# Patient Record
Sex: Male | Born: 1980 | Race: White | Hispanic: No | Marital: Married
Health system: Southern US, Community
[De-identification: ages and names within clinical notes are randomized; demographics above are authoritative.]

---

## 2016-05-02 ENCOUNTER — Other Ambulatory Visit: Payer: Self-pay | Admitting: Family

## 2016-05-02 DIAGNOSIS — M7138 Other bursal cyst, other site: Secondary | ICD-10-CM

## 2016-05-07 ENCOUNTER — Ambulatory Visit
Admission: RE | Admit: 2016-05-07 | Discharge: 2016-05-07 | Disposition: A | Discharge status: Home / Self Care | Payer: 59 | Source: Ambulatory Visit | Attending: Family | Admitting: Family

## 2016-05-07 DIAGNOSIS — M7138 Other bursal cyst, other site: Secondary | ICD-10-CM

## 2016-05-28 ENCOUNTER — Ambulatory Visit: Payer: Self-pay | Admitting: Surgery

## 2016-05-28 NOTE — H&P (Signed)
Jeremy SalonBrian J. Aoun 05/28/2016 8:39 AM Location: [facility] Patient #: [id number] DOB: 06/21/1981 Married / Language: English / Race: White Male  History of Present Illness Ardeth Sportsman(Kaysie Michelini C. Jennalee Greaves MD; 05/28/2016 10:33 AM) The patient is a 35 year old male who presents with a soft tissue mass. Note for "Soft tissue mass": Patient sent for surgical consultation at the request of Boneta LucksJennifer Brown, nurse practitioner. Concern for scalp and back masses.  Pleasant 35 year old. Active male. Does not smoke. Has had a few small lumps removed off the scalp under local by PCP & dermatology. He has a new one now. It is the largest one. It gets uncomfortable and painful. Has not been infected or draining. Patient also notes he's had some lower back discomfort and persistent lump despite his massage therapist mother working to try and soften it. It's been there for several months. Consult made for that as well. He has no history of other skin or soft tissue problems. 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 He comes today with his wife. Has tattooing and piercings. No problems with infections or cellulitis related to those interventions.   Other Problems Gilmer Mor(Sonya Bynum, CMA; 05/28/2016 8:39 AM) Anxiety Disorder Depression High blood pressure Seizure Disorder  Diagnostic Studies History Gilmer Mor(Sonya Bynum, CMA; 05/28/2016 8:39 AM) Colonoscopy never  Allergies (Sonya Bynum, CMA; 05/28/2016 8:40 AM) Zithromax *MACROLIDES*  Medication History (Sonya Bynum, CMA; 05/28/2016 8:40 AM) Flonase (50MCG/ACT Suspension, Nasal as needed) Active. Medications Reconciled  Social History Gilmer Mor(Sonya Bynum, CMA; 05/28/2016 8:39 AM) Alcohol use Moderate alcohol use. No caffeine use No drug use Tobacco use Former smoker.  Family History Gilmer Mor(Sonya Bynum, CMA; 05/28/2016 8:39 AM) Alcohol Abuse Family Members In General. Arthritis Family Members In General. Breast Cancer Family  Members In General. Cancer Family Members In General. Depression Father, Mother. Heart Disease Family Members In General. Hypertension Family Members In General. Melanoma Family Members In General. Thyroid problems Father, Mother.     Review of Systems Lamar Laundry(Sonya Bynum CMA; 05/28/2016 8:39 AM) General Not Present- Appetite Loss, Chills, Fatigue, Fever, Night Sweats, Weight Gain and Weight Loss. Skin Not Present- Change in Wart/Mole, Dryness, Hives, Jaundice, New Lesions, Non-Healing Wounds, Rash and Ulcer. HEENT Present- Nose Bleed, Seasonal Allergies, Sinus Pain and Wears glasses/contact lenses. Not Present- Earache, Hearing Loss, Hoarseness, Oral Ulcers, Ringing in the Ears, Sore Throat, Visual Disturbances and Yellow Eyes. Respiratory Present- Snoring. Not Present- Bloody sputum, Chronic Cough, Difficulty Breathing and Wheezing. Breast Not Present- Breast Mass, Breast Pain, Nipple Discharge and Skin Changes. Gastrointestinal Present- Excessive gas and Nausea. Not Present- Abdominal Pain, Bloating, Bloody Stool, Change in Bowel Habits, Chronic diarrhea, Constipation, Difficulty Swallowing, Gets full quickly at meals, Hemorrhoids, Indigestion, Rectal Pain and Vomiting. Male Genitourinary Not Present- Blood in Urine, Change in Urinary Stream, Frequency, Impotence, Nocturia, Painful Urination, Urgency and Urine Leakage. Musculoskeletal Present- Back Pain and Muscle Pain. Not Present- Joint Pain, Joint Stiffness, Muscle Weakness and Swelling of Extremities. Neurological Not Present- Decreased Memory, Fainting, Headaches, Numbness, Seizures, Tingling, Tremor, Trouble walking and Weakness. Psychiatric Present- Depression and Frequent crying. Not Present- Anxiety, Bipolar, Change in Sleep Pattern and Fearful. Endocrine Not Present- Cold Intolerance, Excessive Hunger, Hair Changes, Heat Intolerance, Hot flashes and New Diabetes. Hematology Not Present- Blood Thinners, Easy Bruising, Excessive  bleeding, Gland problems, HIV and Persistent Infections.  Vitals (Sonya Bynum CMA; 05/28/2016 8:39 AM) 05/28/2016 8:39 AM Weight: 254 lb Height: 66in Body Surface Area: 2.21 m Body Mass Index: 41 kg/m  Temp.: 53F(Temporal)  Pulse:  79 (Regular)  BP: 128/82 (Sitting, Left Arm, Standard)      Physical Exam Ardeth Sportsman MD; 05/28/2016 9:22 AM)  General Mental Status-Alert. General Appearance-Not in acute distress, Not Sickly. Orientation-Oriented X3. Hydration-Well hydrated. Voice-Normal.  Integumentary Global Assessment Upon inspection and palpation of skin surfaces of the - Axillae: non-tender, no inflammation or ulceration, no drainage. and Distribution of scalp and body hair is normal. General Characteristics Temperature - normal warmth is noted. Note: Numerous tattoos on upper extremity and back. Anterior trunk spared  Head and Neck Head-normocephalic, atraumatic with no lesions or palpable masses. Face Global Assessment - atraumatic, no absence of expression. Neck Global Assessment - no abnormal movements, no bruit auscultated on the right, no bruit auscultated on the left, no decreased range of motion, non-tender. Trachea-midline. Thyroid Gland Characteristics - non-tender. Note: 2 x 2 centimeter mass on right posterior occiput of the head. 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 No active cellulitis or inflammation. No drainage.  Eye Eyeball - Left-Extraocular movements intact, No Nystagmus. Eyeball - Right-Extraocular movements intact, No Nystagmus. Cornea - Left-No Hazy. Cornea - Right-No Hazy. Sclera/Conjunctiva - Left-No scleral icterus, No Discharge. Sclera/Conjunctiva - Right-No scleral icterus, No Discharge. Pupil - Left-Direct reaction to light normal. Pupil - Right-Direct reaction to light normal. Note: Wears glasses. Vision acceptable  ENMT Ears Pinna - Left - no drainage observed, no generalized tenderness  observed. Right - no drainage observed, no generalized tenderness observed. Nose and Sinuses External Inspection of the Nose - no destructive lesion observed. Inspection of the nares - Left - quiet respiration. Right - quiet respiration. Mouth and Throat Lips - Upper Lip - no fissures observed, no pallor noted. Lower Lip - no fissures observed, no pallor noted. Nasopharynx - no discharge present. Oral Cavity/Oropharynx - Tongue - no dryness observed. Oral Mucosa - no cyanosis observed. Hypopharynx - no evidence of airway distress observed.  Chest and Lung Exam Inspection Movements - Normal and Symmetrical. Accessory muscles - No use of accessory muscles in breathing. Palpation Palpation of the chest reveals - Non-tender. Auscultation Breath sounds - Normal and Clear.  Breast Note: Numerous masses. Nipple piercings clean. No cellulitis or infection.  Cardiovascular Auscultation Rhythm - Regular. Murmurs & Other Heart Sounds - Auscultation of the heart reveals - No Murmurs and No Systolic Clicks.  Abdomen Inspection Inspection of the abdomen reveals - No Visible peristalsis and No Abnormal pulsations. Umbilicus - No Bleeding, No Urine drainage. Palpation/Percussion Palpation and Percussion of the abdomen reveal - Soft, Non Tender, No Rebound tenderness, No Rigidity (guarding) and No Cutaneous hyperesthesia. Note: Abdomen soft. Nontender, nondistended. No guarding. No umbilical no other hernias  Male Genitourinary Sexual Maturity Tanner 5 - Adult hair pattern and Adult penile size and shape. Note: No inguinal hernias. Normal external genitalia. Epididymi, testes, and spermatic cords normal without any masses.  Peripheral Vascular Upper Extremity Inspection - Left - No Cyanotic nailbeds, Not Ischemic. Right - No Cyanotic nailbeds, Not Ischemic.  Neurologic Neurologic evaluation reveals -normal attention span and ability to concentrate, able to name objects and repeat phrases.  Appropriate fund of knowledge , normal sensation and normal coordination. Mental Status Affect - not angry, not paranoid. Cranial Nerves-Normal Bilaterally. Gait-Normal.  Neuropsychiatric Mental status exam performed with findings of-able to articulate well with normal speech/language, rate, volume and coherence, thought content normal with ability to perform basic computations and apply abstract reasoning and no evidence of hallucinations, delusions, obsessions or homicidal/suicidal ideation.  Musculoskeletal Global Assessment Spine, Ribs and Pelvis - no  instability, subluxation or laxity. Right Upper Extremity - no instability, subluxation or laxity. Note: Deep nodule on his right posterior hip on his lower back. Deep to a Compass tattoo. Smooth and ellipsoid but deeply fixed to most likely posterior hip crest on the right side. 3 x 2 x 2 cm. Most likely a lymph node or lipoma.  Lymphatic Head & Neck  General Head & Neck Lymphatics: Bilateral - Description - No Localized lymphadenopathy. Axillary  General Axillary Region: Bilateral - Description - No Localized lymphadenopathy. Femoral & Inguinal  Generalized Femoral & Inguinal Lymphatics: Left - Description - No Localized lymphadenopathy. Right - Description - No Localized lymphadenopathy.    Assessment & Plan Ardeth Sportsman(Pia Jedlicka C. Henrine Hayter MD; 05/28/2016 9:21 AM)  SCALP MASS (R22.0) Impression: 2 cm posterior scalp mass. 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  I think he would benefit from removal since it is getting larger and painful. Think this would best done with some sedation and to allow hemostasis. He is interested in proceeding.  We'll try to do it at the same time as the back incision.  MASS OF SUBCUTANEOUS TISSUE OF BACK (R22.2) Impression: Deep subcutaneous mass on right posterior back adherent to the posterior iliac crest. Hidden deep to a a compass tattoo. Most likely a enlarged lymph node or lipoma.  Because it is  persistent for at least 2 months with some discomfort new, I recommended excision. Would do under deep sedation with local anesthetic and same time as excision of pilar cyst. It seems smooth and not rapidly enlarging, so hold off on any MRI/CT scan.  Current Plans You are being scheduled for surgery - Our schedulers will call you.  You should hear from our office's scheduling department within 5 working days about the location, date, and time of surgery. We try to make accommodations for patient's preferences in scheduling surgery, but sometimes the OR schedule or the surgeon's schedule prevents us from making those accommodations.  If you have not heard from our office [phone]) in 5 working days, call the office and ask for your surgeon's nurse.  If you have other questions about your diagnosis, plan, or surgery, call the office and ask for your surgeon's nurse.  The pathophysiology of skin & subcutaneous masses was discussed. Natural history risks without surgery were discussed. I recommended surgery to remove the mass. I explained the technique of removal with use of local anesthesia & possible need for more aggressive sedation/anesthesia for patient comfort.  Risks such as bleeding, infection, wound breakdown, heart attack, death, and other risks were discussed. I noted a good likelihood this will help address the problem. Possibility that this will not correct all symptoms was explained. Possibility of regrowth/recurrence of the mass was discussed. We will work to minimize complications. Questions were answered. The patient expresses understanding & wishes to proceed with surgery.  Pt Education - [facility] General Post-op HCI  Ardeth SportsmanSteven C. Frisco Cordts, M.D., F.A.C.S. Gastrointestinal and Minimally Invasive Surgery [facility] [address] [place], [place] [id number] [phone] Main / Paging

## 2016-05-29 ENCOUNTER — Other Ambulatory Visit: Payer: Self-pay | Admitting: Family

## 2016-05-29 DIAGNOSIS — G8929 Other chronic pain: Secondary | ICD-10-CM

## 2016-05-29 DIAGNOSIS — M545 Low back pain, unspecified: Secondary | ICD-10-CM

## 2017-11-27 IMAGING — US US PELVIS LIMITED
1 series · 11 of 11 positions shown · non-contrast
Comparison: None.

CLINICAL DATA: Palpable right low back region

EXAM:
LIMITED ULTRASOUND OF PELVIS
TECHNIQUE: Limited transabdominal ultrasound examination of the pelvis was
performed.

[Series 1: us pelvis limited · 0.07mm/px · 11 acquisitions, 11 frames shown]
[im 1/11]
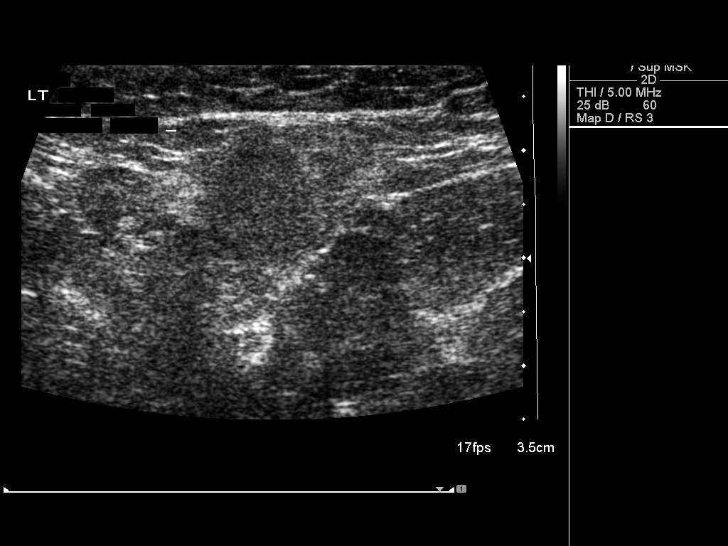
[im 2/11]
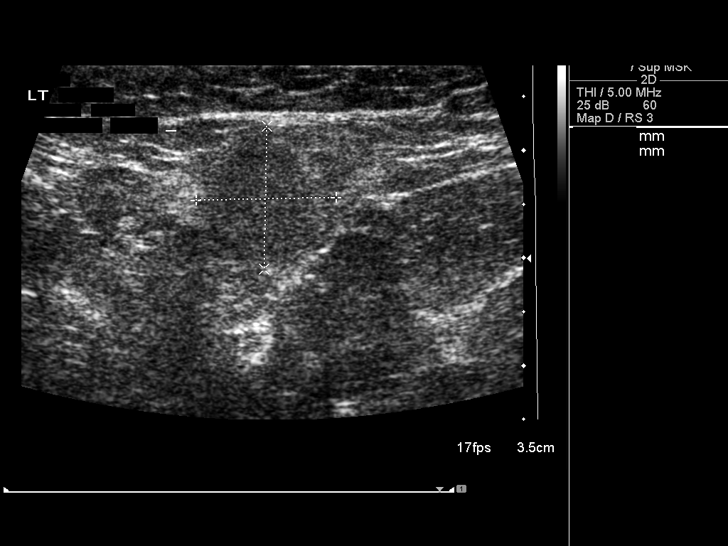
[im 3/11]
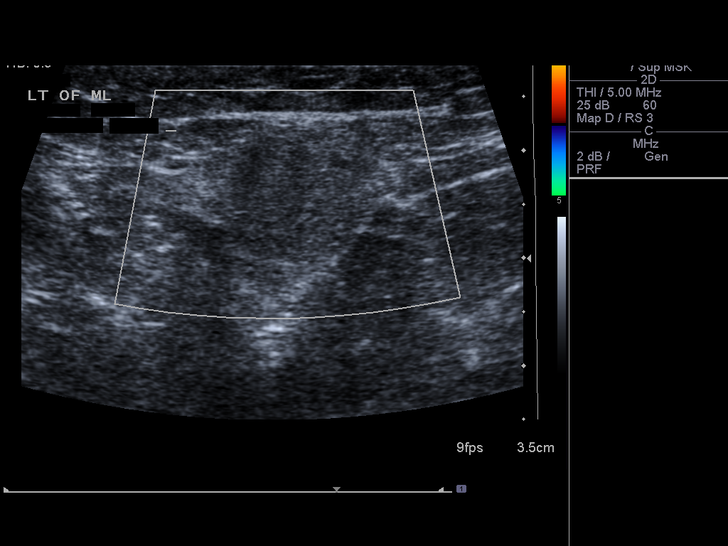
[im 4/11]
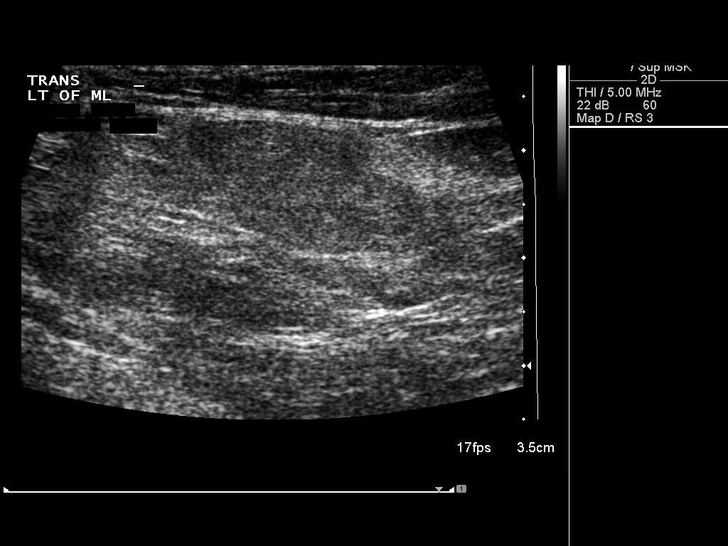
[im 5/11]
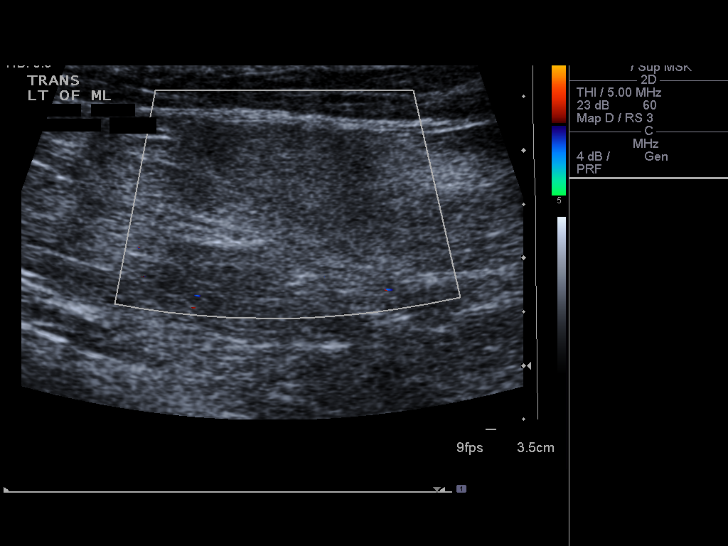
[im 6/11]
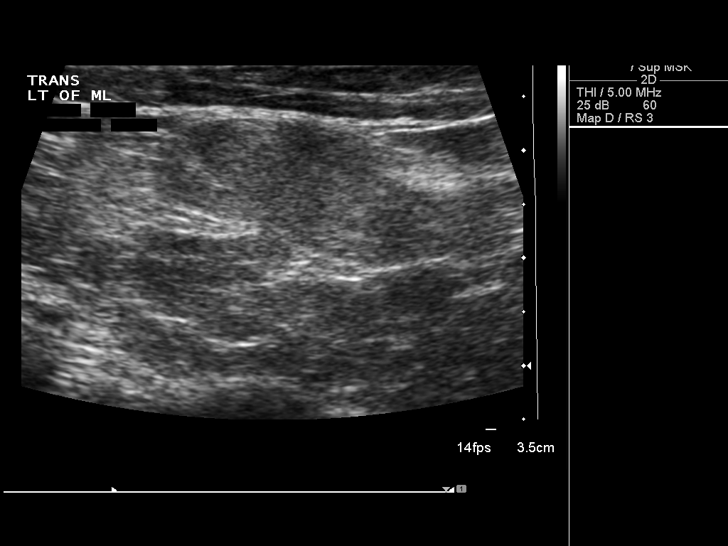
[im 7/11]
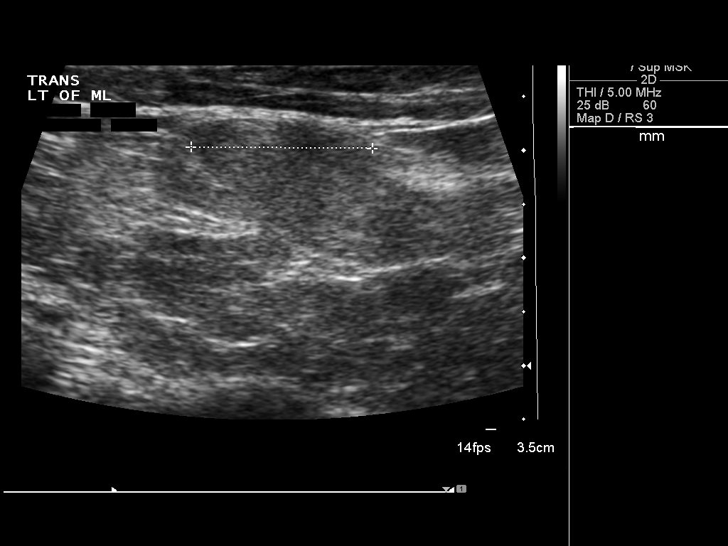
[im 8/11]
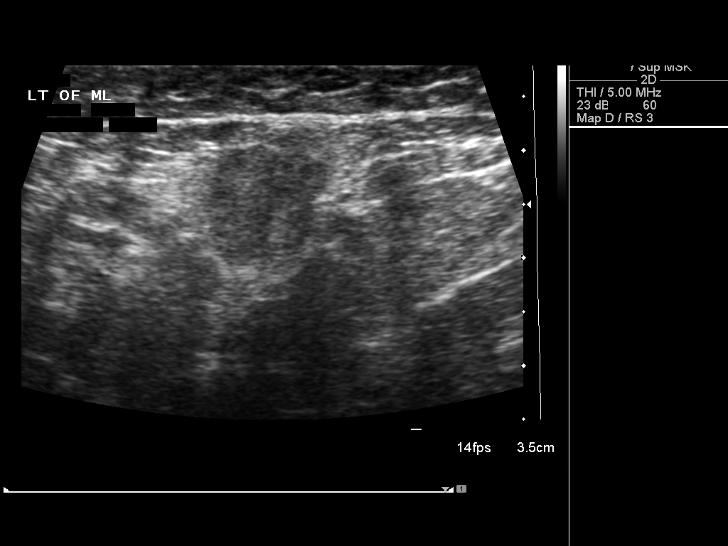
[im 9/11]
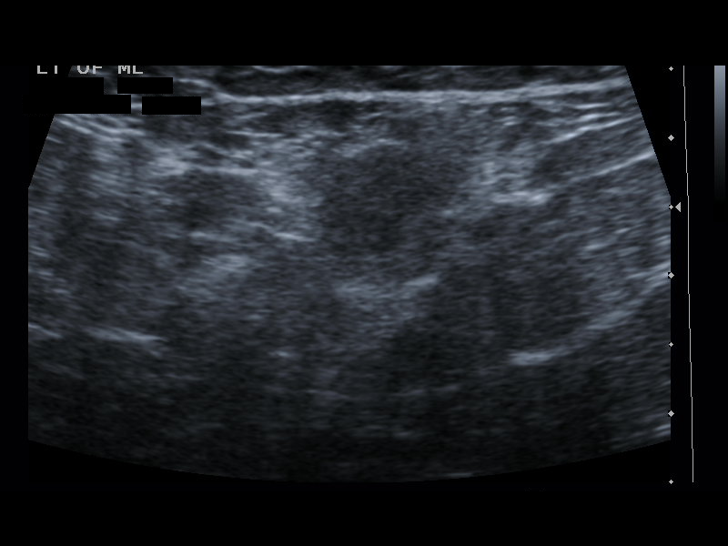
[im 10/11]
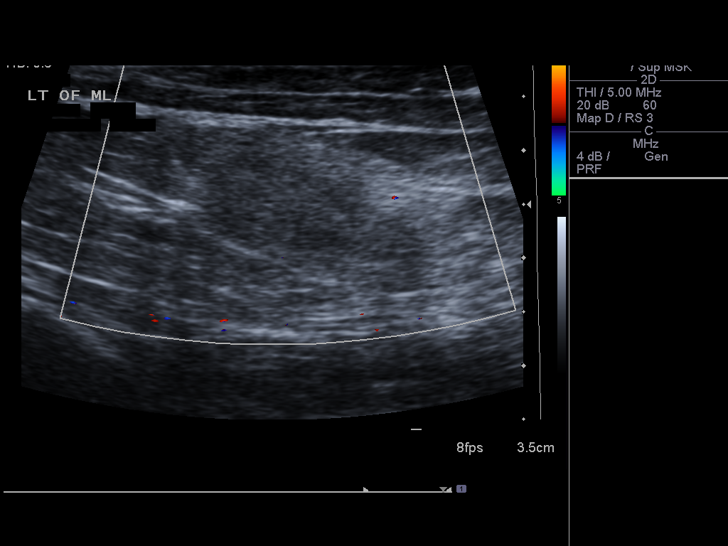
[im 11/11]
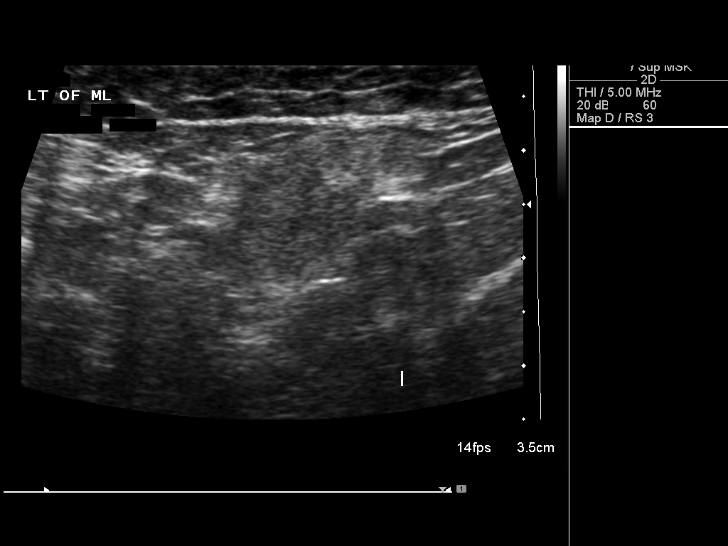

[11 of 11 positions shown; findings below may reference images not displayed]

FINDINGS: There is a poorly marginated 17 x 13 mm hypoechoic solid-appearing
deep subcutaneous process corresponding to palpable region. No
hyperemia. No cystic component.
IMPRESSION: 1. 17 mm soft tissue nodule corresponding to palpable lesion, with
indeterminate imaging characteristics.
